# Patient Record
Sex: Female | Born: 1965 | Hispanic: No | Marital: Married | Smoking: Never smoker
Health system: Southern US, Community
[De-identification: ages and names within clinical notes are randomized; demographics above are authoritative.]

## PROBLEM LIST (undated history)

## (undated) DIAGNOSIS — C801 Malignant (primary) neoplasm, unspecified: Secondary | ICD-10-CM

## (undated) HISTORY — DX: Malignant (primary) neoplasm, unspecified: C80.1

---

## 2003-10-08 ENCOUNTER — Emergency Department (HOSPITAL_COMMUNITY): Admission: EM | Admit: 2003-10-08 | Discharge: 2003-10-08 | Payer: Self-pay | Admitting: Emergency Medicine

## 2007-07-17 ENCOUNTER — Ambulatory Visit: Payer: Self-pay | Admitting: Unknown Physician Specialty

## 2008-07-17 ENCOUNTER — Ambulatory Visit: Payer: Self-pay | Admitting: Unknown Physician Specialty

## 2009-07-21 ENCOUNTER — Ambulatory Visit: Payer: Self-pay | Admitting: Unknown Physician Specialty

## 2010-04-21 ENCOUNTER — Ambulatory Visit: Payer: Self-pay | Admitting: Unknown Physician Specialty

## 2010-05-12 ENCOUNTER — Ambulatory Visit: Payer: Self-pay | Admitting: General Surgery

## 2010-05-27 ENCOUNTER — Ambulatory Visit: Payer: Self-pay | Admitting: Anesthesiology

## 2010-05-31 ENCOUNTER — Ambulatory Visit: Payer: Self-pay | Admitting: General Surgery

## 2010-06-01 LAB — PATHOLOGY REPORT

## 2010-06-08 ENCOUNTER — Ambulatory Visit: Payer: Self-pay | Admitting: Internal Medicine

## 2010-06-25 ENCOUNTER — Ambulatory Visit: Payer: Self-pay | Admitting: Unknown Physician Specialty

## 2010-07-09 ENCOUNTER — Ambulatory Visit: Payer: Self-pay | Admitting: Internal Medicine

## 2010-08-08 ENCOUNTER — Ambulatory Visit: Payer: Self-pay | Admitting: Internal Medicine

## 2010-09-08 ENCOUNTER — Ambulatory Visit: Payer: Self-pay | Admitting: Internal Medicine

## 2010-10-22 ENCOUNTER — Ambulatory Visit: Payer: Self-pay | Admitting: Internal Medicine

## 2010-11-08 ENCOUNTER — Ambulatory Visit: Payer: Self-pay | Admitting: Internal Medicine

## 2011-05-12 ENCOUNTER — Ambulatory Visit (INDEPENDENT_AMBULATORY_CARE_PROVIDER_SITE_OTHER): Payer: 59 | Admitting: Obstetrics & Gynecology

## 2011-05-12 ENCOUNTER — Encounter: Payer: Self-pay | Admitting: Obstetrics & Gynecology

## 2011-05-12 VITALS — BP 153/96 | HR 78 | Resp 17 | Ht 63.0 in | Wt 129.0 lb

## 2011-05-12 DIAGNOSIS — Z124 Encounter for screening for malignant neoplasm of cervix: Secondary | ICD-10-CM

## 2011-05-12 DIAGNOSIS — Z0000 Encounter for general adult medical examination without abnormal findings: Secondary | ICD-10-CM

## 2011-05-12 DIAGNOSIS — Z113 Encounter for screening for infections with a predominantly sexual mode of transmission: Secondary | ICD-10-CM

## 2011-05-12 DIAGNOSIS — Z01419 Encounter for gynecological examination (general) (routine) without abnormal findings: Secondary | ICD-10-CM

## 2011-05-12 NOTE — Progress Notes (Signed)
Subjective:    Brittany Underwood is a 46 y.o. female who presents for an annual exam. She says that her husband complains with sex that he can feel her IUD strings. The patient is sexually active. GYN screening history: last pap: was normal. The patient wears seatbelts: yes. The patient participates in regular exercise: not asked. Has the patient ever been transfused or tattooed?: no. The patient reports that there is not domestic violence in her life.   Menstrual History: OB History    Grav Para Term Preterm Abortions TAB SAB Ect Mult Living   4 4 4       4       Menarche age:  No LMP recorded. Patient is not currently having periods (Reason: Other).    The following portions of the patient's history were reviewed and updated as appropriate: allergies, current medications, past family history, past medical history, past social history, past surgical history and problem list.  Review of Systems A comprehensive review of systems was negative. She has not had a period since her chemotherapy June 2012. She also had an IUD placed at about the same time. She will call and schedule her mammogram. She has been married for 8 years to her second husband.   Objective:    BP 153/96  Pulse 78  Resp 17  Ht 5\' 3"  (1.6 m)  Wt 129 lb (58.514 kg)  BMI 22.85 kg/m2  General Appearance:    Alert, cooperative, no distress, appears stated age  Head:    Normocephalic, without obvious abnormality, atraumatic  Eyes:    PERRL, conjunctiva/corneas clear, EOM's intact, fundi    benign, both eyes  Ears:    Normal TM's and external ear canals, both ears  Nose:   Nares normal, septum midline, mucosa normal, no drainage    or sinus tenderness  Throat:   Lips, mucosa, and tongue normal; teeth and gums normal  Neck:   Supple, symmetrical, trachea midline, no adenopathy;    thyroid:  no enlargement/tenderness/nodules; no carotid   bruit or JVD  Back:     Symmetric, no curvature, ROM normal, no CVA tenderness   Lungs:     Clear to auscultation bilaterally, respirations unlabored  Chest Wall:    No tenderness or deformity   Heart:    Regular rate and rhythm, S1 and S2 normal, no murmur, rub   or gallop  Breast Exam:    No tenderness, masses, or nipple abnormality  Abdomen:     Soft, non-tender, bowel sounds active all four quadrants,    no masses, no organomegaly  Genitalia:    Normal female without lesion, discharge or tenderness, NSSA, NT, mobile, no adnexal masses, Her IUD strings (which look more like Copper T strings) were cut flush with the external os     Extremities:   Extremities normal, atraumatic, no cyanosis or edema  Pulses:   2+ and symmetric all extremities  Skin:   Skin color, texture, turgor normal, no rashes or lesions  Lymph nodes:   Cervical, supraclavicular, and axillary nodes normal  Neurologic:   CNII-XII intact, normal strength, sensation and reflexes    throughout  .    Assessment:    Healthy female exam.    Plan:     Mammogram. Pap smear.  FSH

## 2011-05-13 LAB — FOLLICLE STIMULATING HORMONE: FSH: 79.5 m[IU]/mL

## 2011-05-31 HISTORY — PX: MASTECTOMY: SHX3

## 2011-07-20 ENCOUNTER — Encounter: Payer: Self-pay | Admitting: Obstetrics & Gynecology

## 2011-08-25 ENCOUNTER — Encounter: Payer: Self-pay | Admitting: Obstetrics & Gynecology

## 2011-08-25 ENCOUNTER — Ambulatory Visit (INDEPENDENT_AMBULATORY_CARE_PROVIDER_SITE_OTHER): Payer: 59 | Admitting: Obstetrics & Gynecology

## 2011-08-25 VITALS — BP 134/91 | HR 73 | Ht 63.0 in | Wt 130.0 lb

## 2011-08-25 DIAGNOSIS — Z780 Asymptomatic menopausal state: Secondary | ICD-10-CM

## 2011-08-25 NOTE — Progress Notes (Signed)
  Subjective:    Patient ID: Brittany Underwood, female    DOB: 1965/04/05, 46 y.o.   MRN: [id number]  HPI  She is here to discuss her lab results. She is amenorrheic with the Copper T IUD. She became amenorrheic after her chemotherapy for breast cancer. She says that she is rarely sexually active and that the IUD strings which I cut at her request last visit are no longer bothering her. I have explained that she is in the post menopausal state and does not need the IUD any longer, but she refuses to have it removed currently. She also would like a note saying that she can return to work. Review of Systems     Objective:   Physical Exam        Assessment & Plan:  Contraception- no longer needed, but she wants to keep her IUD for the present. I have given her a note to return to work.

## 2011-08-25 NOTE — Progress Notes (Signed)
Patient would like to return to work and wants to be sure with you that this is ok.

## 2012-08-22 ENCOUNTER — Ambulatory Visit (INDEPENDENT_AMBULATORY_CARE_PROVIDER_SITE_OTHER): Payer: 59 | Admitting: Obstetrics & Gynecology

## 2012-08-22 ENCOUNTER — Encounter: Payer: Self-pay | Admitting: Obstetrics & Gynecology

## 2012-08-22 VITALS — BP 127/96 | HR 76 | Ht 63.0 in | Wt 123.0 lb

## 2012-08-22 DIAGNOSIS — Z800 Family history of malignant neoplasm of digestive organs: Secondary | ICD-10-CM

## 2012-08-22 DIAGNOSIS — Z1151 Encounter for screening for human papillomavirus (HPV): Secondary | ICD-10-CM

## 2012-08-22 DIAGNOSIS — Z01419 Encounter for gynecological examination (general) (routine) without abnormal findings: Secondary | ICD-10-CM

## 2012-08-22 DIAGNOSIS — Z124 Encounter for screening for malignant neoplasm of cervix: Secondary | ICD-10-CM

## 2012-08-22 DIAGNOSIS — Z0000 Encounter for general adult medical examination without abnormal findings: Secondary | ICD-10-CM

## 2012-08-22 NOTE — Progress Notes (Signed)
Subjective:    Brittany Underwood is a 47 y.o. female who presents for an annual exam. The patient has no complaints today. The patient is sexually active. GYN screening history: last pap: was normal. The patient wears seatbelts: yes. The patient participates in regular exercise: no. Has the patient ever been transfused or tattooed?: no. The patient reports that there is not domestic violence in her life.   Menstrual History: OB History   Grav Para Term Preterm Abortions TAB SAB Ect Mult Living   4 4 4       4       Menarche age: 38  No LMP recorded. Patient is not currently having periods (Reason: Other).    The following portions of the patient's history were reviewed and updated as appropriate: allergies, current medications, past family history, past medical history, past social history, past surgical history and problem list.  Review of Systems A comprehensive review of systems was negative. She has been married for 9 years. No dyspareunia, rare sex (monthly) She works at [employer], builds Retail buyer. She reports that her mammogram was normal 4/14.   Objective:    BP 127/96  Pulse 76  Ht 5\' 3"  (1.6 m)  Wt 123 lb (55.792 kg)  BMI 21.79 kg/m2  General Appearance:    Alert, cooperative, no distress, appears stated age  Head:    Normocephalic, without obvious abnormality, atraumatic  Eyes:    PERRL, conjunctiva/corneas clear, EOM's intact, fundi    benign, both eyes  Ears:    Normal TM's and external ear canals, both ears  Nose:   Nares normal, septum midline, mucosa normal, no drainage    or sinus tenderness  Throat:   Lips, mucosa, and tongue normal; teeth and gums normal  Neck:   Supple, symmetrical, trachea midline, no adenopathy;    thyroid:  no enlargement/tenderness/nodules; no carotid   bruit or JVD  Back:     Symmetric, no curvature, ROM normal, no CVA tenderness  Lungs:     Clear to auscultation bilaterally, respirations unlabored  Chest Wall:    No tenderness or  deformity   Heart:    Regular rate and rhythm, S1 and S2 normal, no murmur, rub   or gallop  Breast Exam:    No tenderness, masses, or nipple abnormality  Abdomen:     Soft, non-tender, bowel sounds active all four quadrants,    no masses, no organomegaly  Genitalia:    Normal female without lesion, discharge or tenderness, mild atrophy, NSSA, NT, mobile, normal tiny adnexa     Extremities:   Extremities normal, atraumatic, no cyanosis or edema, there is a swelling of 3 cm on the dorsum of her left foot with a scab on top. She says that this is a "birth mark" and that a dermatologist said it was normal.  Pulses:   2+ and symmetric all extremities  Skin:   Skin color, texture, turgor normal, no rashes or lesions  Lymph nodes:   Cervical, supraclavicular, and axillary nodes normal  Neurologic:   CNII-XII intact, normal strength, sensation and reflexes    throughout  .    Assessment:    Healthy female exam.  76 yo brother with colon cancer Menopause   Plan:     Thin prep Pap smear.  GI referral for possible colonoscopy Rec weight bearing exercise She declines to have her Paraguard removed until it is expired. She understands that it is not needed for birth control.

## 2012-11-03 ENCOUNTER — Encounter: Payer: Self-pay | Admitting: *Deleted

## 2013-03-04 IMAGING — PT NM PET TUM IMG RESTAG (PS) SKULL BASE T - THIGH
1 series · 1 of 1 positions shown · non-contrast
Comparison: none

REASON FOR EXAM: stage 2 node positive breast CA
COMMENTS:

[Series 1004: mm oncology reading_mm oncology reading result ima · 0.65mm/px · 1 of 1 slices shown]
[im 1/1]
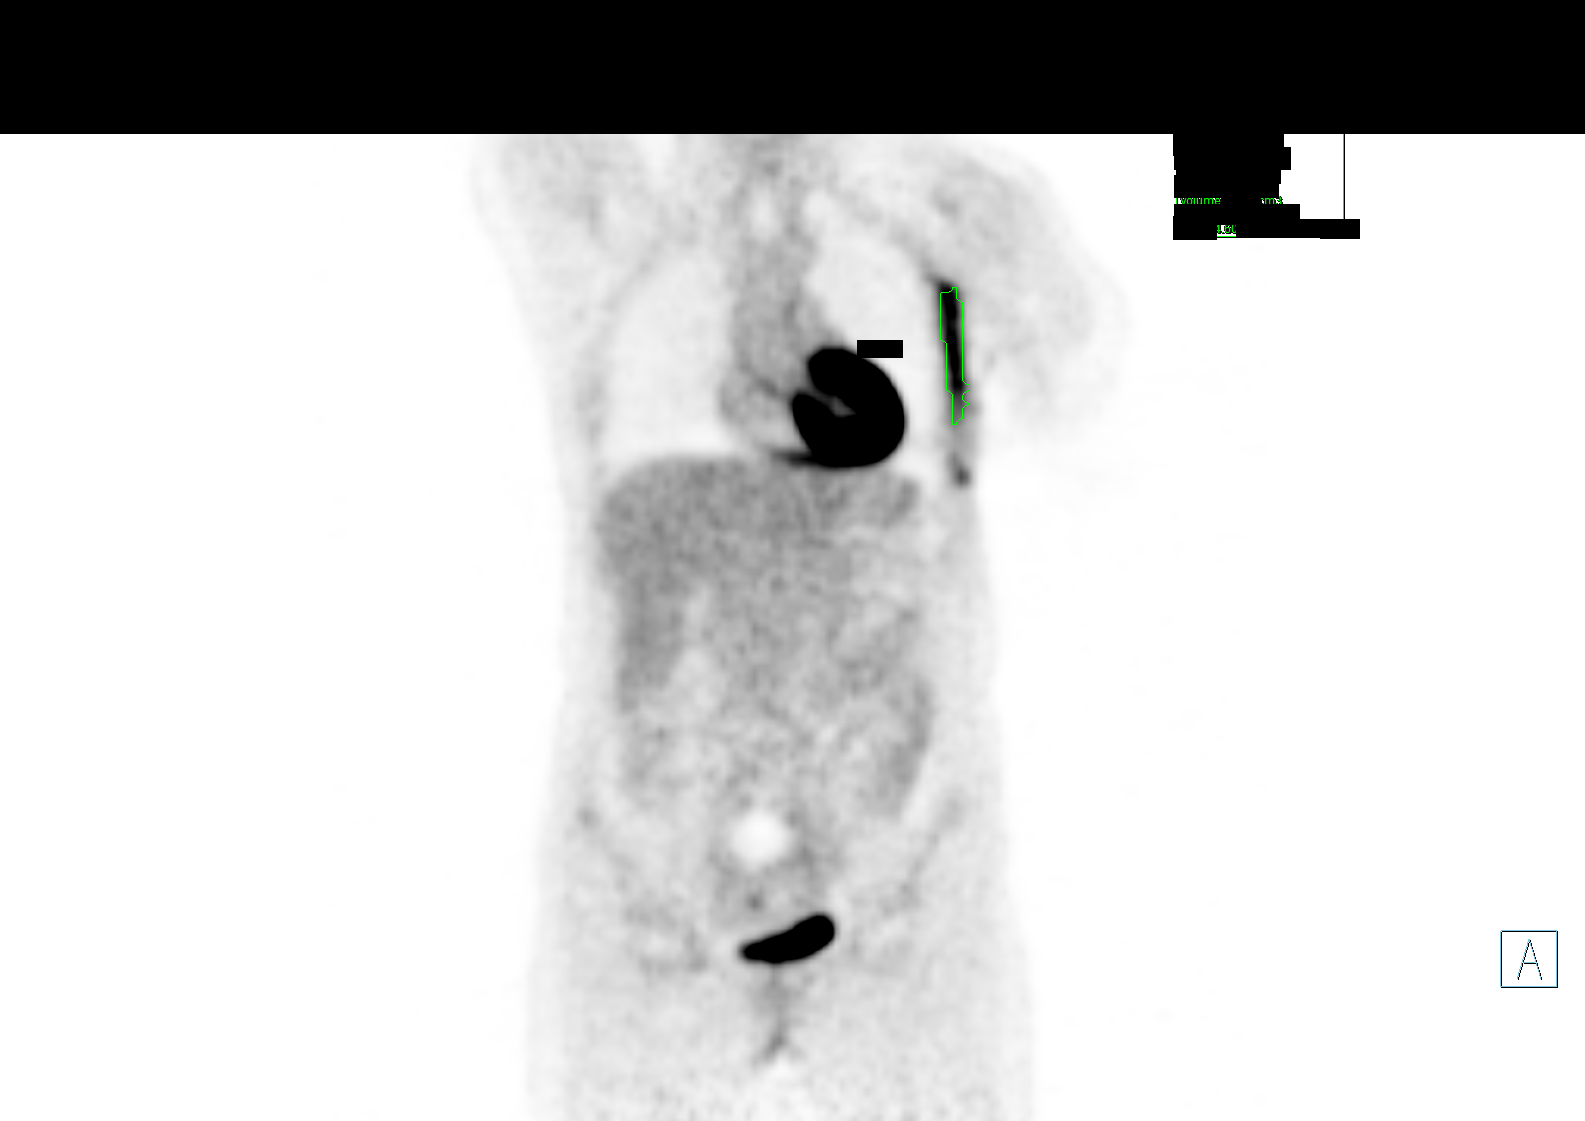

[1 of 1 positions shown; findings below may reference images not displayed]

PROCEDURE:     PET - PET/CT RESTG BREAST CA  - June 25, 2010 [DATE]

RESULT:     The patient has a fasting blood glucose level of 100 mg/dl. The
patient received an injection of 11.68 mCi of fluorine-00 labeled
fluorodeoxyglucose in the right antecubital region at [DATE] a.m. with imaging
performed from the base of the brain into the thighs between the hours of
[DATE] a.m. and [DATE] a.m. Non-contrast CT is performed over the same regions
for the purposes of attenuation, correction and fusion. The noncontrast CT,
attenuation corrected PET images and fused PET/CT data are reconstructed in
the axial, coronal and sagittal planes by the [employer] software for
interpretation. There is no previous exam for comparison.

There is a history of left mastectomy. There appears to be a surgical drain
in the surgical bed extending to the left axilla and exiting anteriorly.
There is increased tracer localization in the region of the surgical bed and
surgical drain with an area of photopenic soft tissue density in the left
breast region which could represent postoperative hematoma or seroma. There
is surrounding increased localization in this region. The activity shows a
maximum localization of 5.7 SUV with a mean of 3.27 SUV in the left chest
wall and surgical bed region. No additional abnormal localization is
evident. There is no evidence of definite abnormal bony localization in the
left chest wall.
IMPRESSION: Uptake in the surgical bed and left chest region in the
chest wall as described. This could be secondary to postoperative changes.
It is difficult to exclude malignancy, but given the presumed recent surgery
with a residual drain in the tissues it is difficult to differentiate
inflammation versus malignant disease. The appearance of the activity is
nonfocal and predominantly at its greatest along the surgical drain. Is
there postoperative infection causing increased localization?

## 2013-05-27 ENCOUNTER — Encounter: Payer: Self-pay | Admitting: *Deleted

## 2013-06-10 ENCOUNTER — Encounter: Payer: Self-pay | Admitting: Obstetrics & Gynecology

## 2013-06-10 ENCOUNTER — Other Ambulatory Visit (HOSPITAL_COMMUNITY): Admission: RE | Admit: 2013-06-10 | Payer: 59 | Source: Ambulatory Visit | Admitting: Obstetrics & Gynecology

## 2013-06-10 ENCOUNTER — Ambulatory Visit (INDEPENDENT_AMBULATORY_CARE_PROVIDER_SITE_OTHER): Payer: 59 | Admitting: Obstetrics & Gynecology

## 2013-06-10 VITALS — BP 108/85 | HR 79 | Ht 63.0 in | Wt 131.0 lb

## 2013-06-10 DIAGNOSIS — Z0000 Encounter for general adult medical examination without abnormal findings: Secondary | ICD-10-CM

## 2013-06-10 DIAGNOSIS — Z1151 Encounter for screening for human papillomavirus (HPV): Secondary | ICD-10-CM

## 2013-06-10 DIAGNOSIS — Z01419 Encounter for gynecological examination (general) (routine) without abnormal findings: Secondary | ICD-10-CM

## 2013-06-10 DIAGNOSIS — Z124 Encounter for screening for malignant neoplasm of cervix: Secondary | ICD-10-CM

## 2013-06-10 NOTE — Progress Notes (Signed)
Subjective:    Brittany Underwood is a 48 y.o. female who presents for an annual exam. The patient has no complaints today. The patient is sexually active. GYN screening history: last pap: was normal. The patient wears seatbelts: yes. The patient participates in regular exercise: yes. Has the patient ever been transfused or tattooed?: no. The patient reports that there is not domestic violence in her life.   Menstrual History: OB History   Grav Para Term Preterm Abortions TAB SAB Ect Mult Living   4 4 4       4       Menarche age: 31  No LMP recorded. Patient is not currently having periods (Reason: IUD).    The following portions of the patient's history were reviewed and updated as appropriate: allergies, current medications, past family history, past medical history, past social history, past surgical history and problem list.  Review of Systems A comprehensive review of systems was negative. Married for 10 years, no dyspareunia. Had normal colonoscopy and good for 5 years. Her mammogram was normal last month.   Objective:    BP 108/85  Pulse 79  Ht 5\' 3"  (1.6 m)  Wt 131 lb (59.421 kg)  BMI 23.21 kg/m2  General Appearance:    Alert, cooperative, no distress, appears stated age  Head:    Normocephalic, without obvious abnormality, atraumatic  Eyes:    PERRL, conjunctiva/corneas clear, EOM's intact, fundi    benign, both eyes  Ears:    Normal TM's and external ear canals, both ears  Nose:   Nares normal, septum midline, mucosa normal, no drainage    or sinus tenderness  Throat:   Lips, mucosa, and tongue normal; teeth and gums normal  Neck:   Supple, symmetrical, trachea midline, no adenopathy;    thyroid:  no enlargement/tenderness/nodules; no carotid   bruit or JVD  Back:     Symmetric, no curvature, ROM normal, no CVA tenderness  Lungs:     Clear to auscultation bilaterally, respirations unlabored  Chest Wall:    No tenderness or deformity   Heart:    Regular rate and rhythm,  S1 and S2 normal, no murmur, rub   or gallop  Breast Exam:    No tenderness, masses, or nipple abnormality on right, left surgically absent, no adenopathy bilaterally  Abdomen:     Soft, non-tender, bowel sounds active all four quadrants,    no masses, no organomegaly  Genitalia:    Normal female without lesion, discharge or tenderness, NSSA, NT, mobile, normal adnexal exam     Extremities:   Extremities normal, atraumatic, no cyanosis or edema  Pulses:   2+ and symmetric all extremities  Skin:   Skin color, texture, turgor normal, no rashes or lesions  Lymph nodes:   Cervical, supraclavicular, and axillary nodes normal  Neurologic:   CNII-XII intact, normal strength, sensation and reflexes    throughout  .    Assessment:    Healthy female exam.    Plan:     Breast self exam technique reviewed and patient encouraged to perform self-exam monthly. Thin prep Pap smear.  Schedule fasting labs

## 2013-06-17 ENCOUNTER — Other Ambulatory Visit (INDEPENDENT_AMBULATORY_CARE_PROVIDER_SITE_OTHER): Payer: 59 | Admitting: *Deleted

## 2013-06-17 DIAGNOSIS — Z01419 Encounter for gynecological examination (general) (routine) without abnormal findings: Secondary | ICD-10-CM

## 2013-06-17 LAB — COMPREHENSIVE METABOLIC PANEL
ALT: 11 U/L (ref 0–35)
AST: 17 U/L (ref 0–37)
Albumin: 4.1 g/dL (ref 3.5–5.2)
Alkaline Phosphatase: 60 U/L (ref 39–117)
BILIRUBIN TOTAL: 1.1 mg/dL (ref 0.2–1.2)
BUN: 19 mg/dL (ref 6–23)
CALCIUM: 9.5 mg/dL (ref 8.4–10.5)
CHLORIDE: 102 meq/L (ref 96–112)
CO2: 24 mEq/L (ref 19–32)
Creat: 0.95 mg/dL (ref 0.50–1.10)
Glucose, Bld: 83 mg/dL (ref 70–99)
POTASSIUM: 4 meq/L (ref 3.5–5.3)
Sodium: 137 mEq/L (ref 135–145)
Total Protein: 7.7 g/dL (ref 6.0–8.3)

## 2013-06-17 LAB — LIPID PANEL
Cholesterol: 239 mg/dL — ABNORMAL HIGH (ref 0–200)
HDL: 73 mg/dL (ref >39)
LDL Cholesterol: 153 mg/dL — ABNORMAL HIGH (ref 0–99)
Total CHOL/HDL Ratio: 3.3 Ratio
Triglycerides: 64 mg/dL (ref <150)
VLDL: 13 mg/dL (ref 0–40)

## 2013-06-17 NOTE — Progress Notes (Signed)
Fasting labs drawn.

## 2013-06-18 LAB — CBC
HCT: 40 % (ref 36.0–46.0)
HEMOGLOBIN: 13 g/dL (ref 12.0–15.0)
MCH: 26.2 pg (ref 26.0–34.0)
MCHC: 32.5 g/dL (ref 30.0–36.0)
MCV: 80.6 fL (ref 78.0–100.0)
PLATELETS: 290 10*3/uL (ref 150–400)
RBC: 4.96 MIL/uL (ref 3.87–5.11)
RDW: 16.3 % — ABNORMAL HIGH (ref 11.5–15.5)
WBC: 6.3 10*3/uL (ref 4.0–10.5)

## 2013-06-18 LAB — TSH: TSH: 3.453 u[IU]/mL (ref 0.350–4.500)

## 2013-09-03 ENCOUNTER — Telehealth: Payer: Self-pay | Admitting: *Deleted

## 2013-09-03 NOTE — Telephone Encounter (Signed)
Spoke to patient briefly.  She said she would have to call me back.

## 2013-09-03 NOTE — Telephone Encounter (Signed)
Message copied by Erik Obey on Tue Sep 03, 2013 10:06 AM ------      Message from: Clovia Cuff C      Created: Thu Jun 27, 2013  9:01 AM       She should go see her FP about her elevated lipids.      Thanks ------

## 2013-12-09 ENCOUNTER — Encounter: Payer: Self-pay | Admitting: Obstetrics & Gynecology

## 2015-02-26 ENCOUNTER — Telehealth: Payer: Self-pay | Admitting: *Deleted

## 2015-02-26 DIAGNOSIS — Z853 Personal history of malignant neoplasm of breast: Secondary | ICD-10-CM

## 2015-02-26 NOTE — Telephone Encounter (Signed)
-----   Message from Francia Greaves sent at 02/24/2015  2:02 PM EST ----- Regarding: Order Contact: [phone] Needs diagnostic order for mammogram, so she can call back and schedule the appt herself, dx w/ breast cancer last year, please call patient once this is done

## 2015-02-26 NOTE — Telephone Encounter (Signed)
Diagnostic mammogram ordered, called pt to inform her that order has been placed.

## 2016-08-23 ENCOUNTER — Other Ambulatory Visit: Payer: Self-pay | Admitting: *Deleted

## 2016-08-23 ENCOUNTER — Inpatient Hospital Stay
Admission: RE | Admit: 2016-08-23 | Discharge: 2016-08-23 | Disposition: A | Discharge status: Home / Self Care | Payer: Self-pay | Source: Ambulatory Visit | Attending: *Deleted | Admitting: *Deleted

## 2016-08-23 DIAGNOSIS — Z9289 Personal history of other medical treatment: Secondary | ICD-10-CM

## 2022-01-27 DIAGNOSIS — M654 Radial styloid tenosynovitis [de Quervain]: Secondary | ICD-10-CM | POA: Diagnosis not present

## 2022-02-10 DIAGNOSIS — M654 Radial styloid tenosynovitis [de Quervain]: Secondary | ICD-10-CM | POA: Diagnosis not present

## 2022-07-15 NOTE — Progress Notes (Unsigned)
New patient visit   Patient: Brittany Underwood   DOB: 08-30-1965   57 y.o. Female  MRN: [id number] Visit Date: 07/18/2022  Today's healthcare provider: Ronnald Ramp, MD   No chief complaint on file.  Subjective    Brittany Underwood is a 57 y.o. female who presents today as a new patient to establish care.  HPI   Encounter to Establish Care Patient presents to establish care  Introduced myself and my role as primary care physician  We reviewed patient's medical, surgical, and social history and medications as listed below    PMHX   Last annual physical: ***   *** Medications: ***   ***  Medications: ***    Social Hx  Tobacco use: *** Alcohol Use : *** Illicit drug use: ***  ***    Concerns for Today:   ***:   Past Medical History:  Diagnosis Date   Cancer (HCC)    Breast  LT   Past Surgical History:  Procedure Laterality Date   MASTECTOMY  05/31/11   Left   Family Status  Relation Name Status   Father  (Not Specified)   Brother  (Not Specified)   Family History  Problem Relation Age of Onset   Hypertension Father    Cancer Brother        colon   Social History   Socioeconomic History   Marital status: Married    Spouse name: Not on file   Number of children: Not on file   Years of education: Not on file   Highest education level: Not on file  Occupational History   Occupation: unemployed  Tobacco Use   Smoking status: Never   Smokeless tobacco: Never  Substance and Sexual Activity   Alcohol use: No   Drug use: No   Sexual activity: Yes    Partners: Male  Other Topics Concern   Not on file  Social History Narrative   Not on file   Social Determinants of Health   Financial Resource Strain: Not on file  Food Insecurity: Not on file  Transportation Needs: Not on file  Physical Activity: Not on file  Stress: Not on file  Social Connections: Not on file   Outpatient Medications Prior to Visit  Medication Sig    IUD's (PARAGARD INTRAUTERINE COPPER IU) by Intrauterine route.   No facility-administered medications prior to visit.   No Known Allergies   There is no immunization history on file for this patient.  Health Maintenance  Topic Date Due   COVID-19 Vaccine (1) Never done   HIV Screening  Never done   Hepatitis C Screening  Never done   DTaP/Tdap/Td (1 - Tdap) Never done   Colonoscopy  Never done   PAP SMEAR-Modifier  06/10/2016   MAMMOGRAM  08/06/2016   Zoster Vaccines- Shingrix (1 of 2) Never done   INFLUENZA VACCINE  09/08/2022   HPV VACCINES  Aged Out    No care team member to display  Review of Systems  {Labs  Heme  Chem  Endocrine  Serology  Results Review (optional):23779}   Objective    There were no vitals taken for this visit. {Show previous vital signs (optional):23777}  Physical Exam ***  Depression Screen     No data to display         No results found for any visits on 07/18/22.  Assessment & Plan     ***  No follow-ups on file.     {  provider attestation***:1}   Ronnald Ramp, MD  [facility] [phone] (phone) [phone] (fax)  [facility]

## 2022-07-18 ENCOUNTER — Ambulatory Visit (INDEPENDENT_AMBULATORY_CARE_PROVIDER_SITE_OTHER): Payer: BC Managed Care – PPO | Admitting: Family Medicine

## 2022-07-18 ENCOUNTER — Encounter: Payer: Self-pay | Admitting: Family Medicine

## 2022-07-18 ENCOUNTER — Other Ambulatory Visit (HOSPITAL_COMMUNITY)
Admission: RE | Admit: 2022-07-18 | Discharge: 2022-07-18 | Disposition: A | Discharge status: Home / Self Care | Payer: BC Managed Care – PPO | Source: Ambulatory Visit | Attending: Family Medicine | Admitting: Family Medicine

## 2022-07-18 VITALS — BP 147/92 | HR 80 | Ht 62.0 in | Wt 142.6 lb

## 2022-07-18 DIAGNOSIS — E663 Overweight: Secondary | ICD-10-CM | POA: Diagnosis not present

## 2022-07-18 DIAGNOSIS — Z1159 Encounter for screening for other viral diseases: Secondary | ICD-10-CM | POA: Diagnosis not present

## 2022-07-18 DIAGNOSIS — Z114 Encounter for screening for human immunodeficiency virus [HIV]: Secondary | ICD-10-CM | POA: Diagnosis not present

## 2022-07-18 DIAGNOSIS — Z202 Contact with and (suspected) exposure to infections with a predominantly sexual mode of transmission: Secondary | ICD-10-CM | POA: Diagnosis not present

## 2022-07-18 DIAGNOSIS — Z7689 Persons encountering health services in other specified circumstances: Secondary | ICD-10-CM

## 2022-07-18 DIAGNOSIS — R030 Elevated blood-pressure reading, without diagnosis of hypertension: Secondary | ICD-10-CM

## 2022-07-18 DIAGNOSIS — Z0000 Encounter for general adult medical examination without abnormal findings: Secondary | ICD-10-CM | POA: Diagnosis not present

## 2022-07-18 DIAGNOSIS — R051 Acute cough: Secondary | ICD-10-CM

## 2022-07-18 DIAGNOSIS — Z124 Encounter for screening for malignant neoplasm of cervix: Secondary | ICD-10-CM

## 2022-07-18 DIAGNOSIS — R2242 Localized swelling, mass and lump, left lower limb: Secondary | ICD-10-CM

## 2022-07-18 DIAGNOSIS — Z1231 Encounter for screening mammogram for malignant neoplasm of breast: Secondary | ICD-10-CM

## 2022-07-18 DIAGNOSIS — Z23 Encounter for immunization: Secondary | ICD-10-CM

## 2022-07-18 MED ORDER — AMLODIPINE BESYLATE 5 MG PO TABS: 5.0000 mg | ORAL_TABLET | Freq: Every day | ORAL | 1 refills | Status: DC

## 2022-07-18 MED ORDER — CETIRIZINE HCL 10 MG PO TABS
10.0000 mg | ORAL_TABLET | Freq: Every day | ORAL | 11 refills | Status: AC
Start: 2022-07-18 — End: ?

## 2022-07-18 MED ORDER — BENZONATATE 200 MG PO CAPS
200.0000 mg | ORAL_CAPSULE | Freq: Two times a day (BID) | ORAL | 0 refills | Status: DC | PRN
Start: 2022-07-18 — End: 2022-08-02

## 2022-07-18 NOTE — Assessment & Plan Note (Signed)
Acute  Present for 3 weeks  Recommended treating symptoms that likely are related to seasonal allergies with cetirizine 10mg  daily  Recommended flonase however patient does not want to use nasal spray  Will also treat cough with tessalon perles 200mg  twice daily PRN  F/u PRN if no improvement

## 2022-07-18 NOTE — Assessment & Plan Note (Signed)
Welcomed patient to [facility]  Reviewed patient's medical history, medications, surgical and social history Discussed roles and expectations for primary care physician-patient relationship Recommended patient schedule annual preventative examinations   

## 2022-07-18 NOTE — Assessment & Plan Note (Signed)
Chronic conditions are stable  Patient was counseled on benefits of regular physical activity with goal of 150 minutes of moderate to vigurous intensity 4 days per week  Patient was counseled to consume well balanced diet of fruits, vegetables, limited saturated fats and limited sugary foods and beverages with emphasis on consuming 6-8 glasses of water daily  Screening recommended today: HIV, Hep C, A1c, lipid panel, STI screenings including RPR, gonorrhea, chlamydia and herpes testing, cervical CA screening was collected today and mammogram ordered  Vaccines recommended today: Shingrix and Tetanus vaccine, patient was agreeable to both vaccines today

## 2022-07-18 NOTE — Assessment & Plan Note (Signed)
Chronic  Reports no prior medications  Will start amlodipine 5mg  daily  CMP collected  F/u in 3 weeks for BP

## 2022-07-18 NOTE — Patient Instructions (Signed)
We will follow up with results of labs once they are available.   I have prescribed cetirizine 10mg  daily to help with your allergy symptoms and tessalon perles to help with your cough    Your blood pressure was elevated today so I recommend starting amlodipine 5mg  daily for blood pressure.   Please see me in 3 weeks for blood pressure follow up    Health Maintenance, Female Adopting a healthy lifestyle and getting preventive care are important in promoting health and wellness. Ask your health care provider about: The right schedule for you to have regular tests and exams. Things you can do on your own to prevent diseases and keep yourself healthy. What should I know about diet, weight, and exercise? Eat a healthy diet  Eat a diet that includes plenty of vegetables, fruits, low-fat dairy products, and lean protein. Do not eat a lot of foods that are high in solid fats, added sugars, or sodium. Maintain a healthy weight Body mass index (BMI) is used to identify weight problems. It estimates body fat based on height and weight. Your health care provider can help determine your BMI and help you achieve or maintain a healthy weight. Get regular exercise Get regular exercise. This is one of the most important things you can do for your health. Most adults should: Exercise for at least 150 minutes each week. The exercise should increase your heart rate and make you sweat (moderate-intensity exercise). Do strengthening exercises at least twice a week. This is in addition to the moderate-intensity exercise. Spend less time sitting. Even light physical activity can be beneficial. Watch cholesterol and blood lipids Have your blood tested for lipids and cholesterol at 56 years of age, then have this test every 5 years. Have your cholesterol levels checked more often if: Your lipid or cholesterol levels are high. You are older than 56 years of age. You are at high risk for heart disease. What  should I know about cancer screening? Depending on your health history and family history, you may need to have cancer screening at various ages. This may include screening for: Breast cancer. Cervical cancer. Colorectal cancer. Skin cancer. Lung cancer. What should I know about heart disease, diabetes, and high blood pressure? Blood pressure and heart disease High blood pressure causes heart disease and increases the risk of stroke. This is more likely to develop in people who have high blood pressure readings or are overweight. Have your blood pressure checked: Every 3-5 years if you are 62-51 years of age. Every year if you are 57 years old or older. Diabetes Have regular diabetes screenings. This checks your fasting blood sugar level. Have the screening done: Once every three years after age 39 if you are at a normal weight and have a low risk for diabetes. More often and at a younger age if you are overweight or have a high risk for diabetes. What should I know about preventing infection? Hepatitis B If you have a higher risk for hepatitis B, you should be screened for this virus. Talk with your health care provider to find out if you are at risk for hepatitis B infection. Hepatitis C Testing is recommended for: Everyone born from [birth year] through 1964. Anyone with known risk factors for hepatitis C. Sexually transmitted infections (STIs) Get screened for STIs, including gonorrhea and chlamydia, if: You are sexually active and are younger than 56 years of age. You are older than 56 years of age and your health care provider  tells you that you are at risk for this type of infection. Your sexual activity has changed since you were last screened, and you are at increased risk for chlamydia or gonorrhea. Ask your health care provider if you are at risk. Ask your health care provider about whether you are at high risk for HIV. Your health care provider may recommend a prescription medicine  to help prevent HIV infection. If you choose to take medicine to prevent HIV, you should first get tested for HIV. You should then be tested every 3 months for as long as you are taking the medicine. Pregnancy If you are about to stop having your period (premenopausal) and you may become pregnant, seek counseling before you get pregnant. Take 400 to 800 micrograms (mcg) of folic acid every day if you become pregnant. Ask for birth control (contraception) if you want to prevent pregnancy. Osteoporosis and menopause Osteoporosis is a disease in which the bones lose minerals and strength with aging. This can result in bone fractures. If you are 63 years old or older, or if you are at risk for osteoporosis and fractures, ask your health care provider if you should: Be screened for bone loss. Take a calcium or vitamin D supplement to lower your risk of fractures. Be given hormone replacement therapy (HRT) to treat symptoms of menopause. Follow these instructions at home: Alcohol use Do not drink alcohol if: Your health care provider tells you not to drink. You are pregnant, may be pregnant, or are planning to become pregnant. If you drink alcohol: Limit how much you have to: 0-1 drink a day. Know how much alcohol is in your drink. In the [place] one drink equals one 12 oz bottle of beer (355 mL), one 5 oz glass of wine (148 mL), or one 1 oz glass of hard liquor (44 mL). Lifestyle Do not use any products that contain nicotine or tobacco. These products include cigarettes, chewing tobacco, and vaping devices, such as e-cigarettes. If you need help quitting, ask your health care provider. Do not use street drugs. Do not share needles. Ask your health care provider for help if you need support or information about quitting drugs. General instructions Schedule regular health, dental, and eye exams. Stay current with your vaccines. Tell your health care provider if: You often feel depressed. You  have ever been abused or do not feel safe at home. Summary Adopting a healthy lifestyle and getting preventive care are important in promoting health and wellness. Follow your health care provider's instructions about healthy diet, exercising, and getting tested or screened for diseases. Follow your health care provider's instructions on monitoring your cholesterol and blood pressure. This information is not intended to replace advice given to you by your health care provider. Make sure you discuss any questions you have with your health care provider. Document Revised: 06/15/2020 Document Reviewed: 06/15/2020 Elsevier Patient Education  2024 [employer].

## 2022-07-18 NOTE — Assessment & Plan Note (Addendum)
CMP, lipid panel, A1c & TSH and free T4 collected today  Advised regular physical activity to help with healthy weight management and including well balanced meals and drinking water 6-8 8-oz cups per day

## 2022-07-18 NOTE — Addendum Note (Signed)
Addended by: Bing Neighbors on: 07/18/2022 04:42 PM   Modules accepted: Orders

## 2022-07-19 LAB — CBC
Hematocrit: 40.2 % (ref 34.0–46.6)
Hemoglobin: 13.1 g/dL (ref 11.1–15.9)
MCH: 25.7 pg — ABNORMAL LOW (ref 26.6–33.0)
MCHC: 32.6 g/dL (ref 31.5–35.7)
MCV: 79 fL (ref 79–97)
Platelets: 370 10*3/uL (ref 150–450)
RBC: 5.09 x10E6/uL (ref 3.77–5.28)
RDW: 15.5 % — ABNORMAL HIGH (ref 11.7–15.4)
WBC: 8.6 10*3/uL (ref 3.4–10.8)

## 2022-07-19 LAB — HEMOGLOBIN A1C
Est. average glucose Bld gHb Est-mCnc: 146 mg/dL
Hgb A1c MFr Bld: 6.7 % — ABNORMAL HIGH (ref 4.8–5.6)

## 2022-07-19 LAB — COMPREHENSIVE METABOLIC PANEL
ALT: 15 IU/L (ref 0–32)
AST: 27 IU/L (ref 0–40)
Albumin/Globulin Ratio: 1.6
Albumin: 4.3 g/dL (ref 3.8–4.9)
Alkaline Phosphatase: 80 IU/L (ref 44–121)
BUN/Creatinine Ratio: 14 (ref 9–23)
BUN: 16 mg/dL (ref 6–24)
Bilirubin Total: 0.3 mg/dL (ref 0.0–1.2)
CO2: 22 mmol/L (ref 20–29)
Calcium: 10.1 mg/dL (ref 8.7–10.2)
Chloride: 105 mmol/L (ref 96–106)
Creatinine, Ser: 1.18 mg/dL — ABNORMAL HIGH (ref 0.57–1.00)
Globulin, Total: 2.7 g/dL (ref 1.5–4.5)
Glucose: 91 mg/dL (ref 70–99)
Potassium: 4.2 mmol/L (ref 3.5–5.2)
Sodium: 144 mmol/L (ref 134–144)
Total Protein: 7 g/dL (ref 6.0–8.5)
eGFR: 55 mL/min/{1.73_m2} — ABNORMAL LOW (ref >59)

## 2022-07-19 LAB — HEPATITIS C ANTIBODY: Hep C Virus Ab: NONREACTIVE

## 2022-07-19 LAB — LIPID PANEL
Chol/HDL Ratio: 4.4 ratio (ref 0.0–4.4)
Cholesterol, Total: 253 mg/dL — ABNORMAL HIGH (ref 100–199)
HDL: 58 mg/dL (ref >39)
LDL Chol Calc (NIH): 151 mg/dL — ABNORMAL HIGH (ref 0–99)
Triglycerides: 244 mg/dL — ABNORMAL HIGH (ref 0–149)
VLDL Cholesterol Cal: 44 mg/dL — ABNORMAL HIGH (ref 5–40)

## 2022-07-19 LAB — RPR: RPR Ser Ql: NONREACTIVE

## 2022-07-19 LAB — HIV ANTIBODY (ROUTINE TESTING W REFLEX): HIV Screen 4th Generation wRfx: NONREACTIVE

## 2022-07-19 LAB — TSH+FREE T4
Free T4: 1.29 ng/dL (ref 0.82–1.77)
TSH: 3.03 u[IU]/mL (ref 0.450–4.500)

## 2022-07-20 ENCOUNTER — Other Ambulatory Visit: Payer: Self-pay

## 2022-07-20 DIAGNOSIS — E119 Type 2 diabetes mellitus without complications: Secondary | ICD-10-CM

## 2022-07-20 DIAGNOSIS — E7800 Pure hypercholesterolemia, unspecified: Secondary | ICD-10-CM

## 2022-07-20 DIAGNOSIS — E782 Mixed hyperlipidemia: Secondary | ICD-10-CM

## 2022-07-20 MED ORDER — METFORMIN HCL 500 MG PO TABS: 500.0000 mg | ORAL_TABLET | Freq: Every day | ORAL | 0 refills | Status: DC

## 2022-07-20 MED ORDER — ROSUVASTATIN CALCIUM 20 MG PO TABS
20.0000 mg | ORAL_TABLET | Freq: Every day | ORAL | 3 refills | Status: AC
Start: 2022-07-20 — End: ?

## 2022-07-25 LAB — CYTOLOGY - PAP
Chlamydia: NEGATIVE
Comment: NEGATIVE
Comment: NEGATIVE
Comment: NEGATIVE
Comment: NEGATIVE
Comment: NORMAL
Diagnosis: NEGATIVE
HSV1: NEGATIVE
HSV2: NEGATIVE
High risk HPV: NEGATIVE
Neisseria Gonorrhea: NEGATIVE
Trichomonas: NEGATIVE

## 2022-08-01 ENCOUNTER — Ambulatory Visit: Payer: Self-pay | Admitting: *Deleted

## 2022-08-01 DIAGNOSIS — R051 Acute cough: Secondary | ICD-10-CM

## 2022-08-01 NOTE — Telephone Encounter (Signed)
Cough Advice   Pt is calling to report ov 07/17/21  Acute cough Acute  Present for 3 weeks reports still coughing requesting refill for benzonatate (TESSALON) 200 MG capsule [[id number]] Please advise

## 2022-08-01 NOTE — Telephone Encounter (Signed)
  Chief Complaint: Lingering cough Symptoms: Cough with clear mucus Frequency: 3 weeks Pertinent Negatives: Patient denies Fever, sob Disposition: [] ED /[] Urgent Care (no appt availability in office) / [] Appointment(In office/virtual)/ []  [place] [facility]/ [] Home Care/ [] Refused Recommended Disposition /[] [place] [facility]/ [x]  Follow-up with PCP Additional Notes: Pt states that she is nearly out of Tessalon, and would like more for lingering cough. Pt states that all other s/s have resolved.  Pt states that on the directions for her new medications, one states that she should take this medication with breakfast. Pt states she does not eat breakfast. Is it ok to take without food.    Summary: Cough Advice   Pt is calling to report ov 07/17/21  Acute cough Acute  Present for 3 weeks reports still coughing requesting refill for benzonatate (TESSALON) 200 MG capsule [[id number]] Please advise       Reason for Disposition  Cough lasts > 3 weeks  Answer Assessment - Initial Assessment Questions 1. ONSET: "When did the cough begin?"      3 weeks 2. SEVERITY: "How bad is the cough today?"      moderate 3. SPUTUM: "Describe the color of your sputum" (none, dry cough; clear, white, yellow, green)     Clear 4. HEMOPTYSIS: "Are you coughing up any blood?" If so ask: "How much?" (flecks, streaks, tablespoons, etc.)     no 5. DIFFICULTY BREATHING: "Are you having difficulty breathing?" If Yes, ask: "How bad is it?" (e.g., mild, moderate, severe)    - MILD: No SOB at rest, mild SOB with walking, speaks normally in sentences, can lie down, no retractions, pulse < 100.    - MODERATE: SOB at rest, SOB with minimal exertion and prefers to sit, cannot lie down flat, speaks in phrases, mild retractions, audible wheezing, pulse 100-120.    - SEVERE: Very SOB at rest, speaks in single words, struggling to breathe, sitting hunched forward, retractions, pulse > 120      no 6. FEVER: "Do you have a  fever?" If Yes, ask: "What is your temperature, how was it measured, and when did it start?"     no   10. OTHER SYMPTOMS: "Do you have any other symptoms?" (e.g., runny nose, wheezing, chest pain)       Just cough  Protocols used: Cough - Chronic-A-AH

## 2022-08-02 MED ORDER — BENZONATATE 200 MG PO CAPS
200.0000 mg | ORAL_CAPSULE | Freq: Two times a day (BID) | ORAL | 0 refills | Status: AC | PRN
Start: 2022-08-02 — End: ?

## 2022-08-02 NOTE — Addendum Note (Signed)
Addended by: Bing Neighbors on: 08/02/2022 11:01 AM   Modules accepted: Orders

## 2022-08-02 NOTE — Telephone Encounter (Signed)
Refill for benzonatate 200mg  twice daily PRN for cough sent to pharmacy   [facility] to take medication any time of day that is convenient for patient.   Dr. Roxan Hockey

## 2022-08-02 NOTE — Telephone Encounter (Signed)
Patient advised. Verbalized understanding

## 2022-08-22 ENCOUNTER — Other Ambulatory Visit: Payer: Self-pay | Admitting: Family Medicine

## 2022-08-22 DIAGNOSIS — E119 Type 2 diabetes mellitus without complications: Secondary | ICD-10-CM

## 2022-08-22 NOTE — Telephone Encounter (Signed)
Requested medications are due for refill today.  yes  Requested medications are on the active medications list.  yes  Last refill. 07/20/2022 #30 0 rf  Future visit scheduled.   yes  Notes to clinic.  Missing labs - platelets.    Requested Prescriptions  Pending Prescriptions Disp Refills   metFORMIN (GLUCOPHAGE) 500 MG tablet [Pharmacy Med Name: METFORMIN 500MG  TABLETS] 30 tablet 0    Sig: TAKE 1 TABLET(500 MG) BY MOUTH DAILY WITH BREAKFAST     Endocrinology:  Diabetes - Biguanides Failed - 08/22/2022  3:23 AM      Failed - Cr in normal range and within 360 days    Creat  Date Value Ref Range Status  06/17/2013 0.95 0.50 - 1.10 mg/dL Final   Creatinine, Ser  Date Value Ref Range Status  07/18/2022 1.18 (H) 0.57 - 1.00 mg/dL Final         Failed - eGFR in normal range and within 360 days    eGFR  Date Value Ref Range Status  07/18/2022 55 (L) >59 mL/min/1.73 Final         Failed - B12 Level in normal range and within 720 days    No results found for: "VITAMINB12"       Failed - CBC within normal limits and completed in the last 12 months    WBC  Date Value Ref Range Status  07/18/2022 8.6 3.4 - 10.8 x10E3/uL Final  06/17/2013 6.3 4.0 - 10.5 K/uL Final   RBC  Date Value Ref Range Status  07/18/2022 5.09 3.77 - 5.28 x10E6/uL Final  06/17/2013 4.96 3.87 - 5.11 MIL/uL Final   Hemoglobin  Date Value Ref Range Status  07/18/2022 13.1 11.1 - 15.9 g/dL Final   Hematocrit  Date Value Ref Range Status  07/18/2022 40.2 34.0 - 46.6 % Final   MCHC  Date Value Ref Range Status  07/18/2022 32.6 31.5 - 35.7 g/dL Final  78/29/5621 30.8 30.0 - 36.0 g/dL Final   [facility]  Date Value Ref Range Status  07/18/2022 25.7 (L) 26.6 - 33.0 pg Final  06/17/2013 26.2 26.0 - 34.0 pg Final   MCV  Date Value Ref Range Status  07/18/2022 79 79 - 97 fL Final   No results found for: "PLTCOUNTKUC", "LABPLAT", "POCPLA" RDW  Date Value Ref Range Status  07/18/2022 15.5 (H) 11.7 - 15.4 %  Final         Passed - HBA1C is between 0 and 7.9 and within 180 days    Hgb A1c MFr Bld  Date Value Ref Range Status  07/18/2022 6.7 (H) 4.8 - 5.6 % Final    Comment:             Prediabetes: 5.7 - 6.4          Diabetes: >6.4          Glycemic control for adults with diabetes: <7.0          Passed - Valid encounter within last 6 months    Recent Outpatient Visits           1 month ago Annual physical exam   [facility] [facility] Simmons-Robinson, Tawanna Cooler, MD       Future Appointments             In 2 weeks Simmons-Robinson, Tawanna Cooler, MD [facility]   In 1 month Simmons-Robinson, [place], MD [facility], [facility]

## 2022-08-24 ENCOUNTER — Ambulatory Visit: Payer: BC Managed Care – PPO | Admitting: Family Medicine

## 2022-08-30 ENCOUNTER — Other Ambulatory Visit: Payer: Self-pay | Admitting: Family Medicine

## 2022-08-30 DIAGNOSIS — E119 Type 2 diabetes mellitus without complications: Secondary | ICD-10-CM

## 2022-08-31 NOTE — Telephone Encounter (Signed)
Requested medication (s) are due for refill today: no  Requested medication (s) are on the active medication list: yes   Last refill:  08/23/22 #30 0 refills   Future visit scheduled: yes in 1 week   Notes to clinic:  To pharmacy: ZERO refills remain on this prescription. Your patient is requesting advance approval of refills for this medication to PREVENT ANY MISSED DOSES   do you want to refill again?     Requested Prescriptions  Pending Prescriptions Disp Refills   metFORMIN (GLUCOPHAGE) 500 MG tablet [Pharmacy Med Name: METFORMIN 500MG  TABLETS] 30 tablet 0    Sig: TAKE 1 TABLET(500 MG) BY MOUTH DAILY WITH BREAKFAST     Endocrinology:  Diabetes - Biguanides Failed - 08/30/2022  8:09 AM      Failed - Cr in normal range and within 360 days    Creat  Date Value Ref Range Status  06/17/2013 0.95 0.50 - 1.10 mg/dL Final   Creatinine, Ser  Date Value Ref Range Status  07/18/2022 1.18 (H) 0.57 - 1.00 mg/dL Final         Failed - eGFR in normal range and within 360 days    eGFR  Date Value Ref Range Status  07/18/2022 55 (L) >59 mL/min/1.73 Final         Failed - B12 Level in normal range and within 720 days    No results found for: "VITAMINB12"       Failed - CBC within normal limits and completed in the last 12 months    WBC  Date Value Ref Range Status  07/18/2022 8.6 3.4 - 10.8 x10E3/uL Final  06/17/2013 6.3 4.0 - 10.5 K/uL Final   RBC  Date Value Ref Range Status  07/18/2022 5.09 3.77 - 5.28 x10E6/uL Final  06/17/2013 4.96 3.87 - 5.11 MIL/uL Final   Hemoglobin  Date Value Ref Range Status  07/18/2022 13.1 11.1 - 15.9 g/dL Final   Hematocrit  Date Value Ref Range Status  07/18/2022 40.2 34.0 - 46.6 % Final   MCHC  Date Value Ref Range Status  07/18/2022 32.6 31.5 - 35.7 g/dL Final  82/95/6213 08.6 30.0 - 36.0 g/dL Final   [facility]  Date Value Ref Range Status  07/18/2022 25.7 (L) 26.6 - 33.0 pg Final  06/17/2013 26.2 26.0 - 34.0 pg Final   MCV  Date Value  Ref Range Status  07/18/2022 79 79 - 97 fL Final   No results found for: "PLTCOUNTKUC", "LABPLAT", "POCPLA" RDW  Date Value Ref Range Status  07/18/2022 15.5 (H) 11.7 - 15.4 % Final         Passed - HBA1C is between 0 and 7.9 and within 180 days    Hgb A1c MFr Bld  Date Value Ref Range Status  07/18/2022 6.7 (H) 4.8 - 5.6 % Final    Comment:             Prediabetes: 5.7 - 6.4          Diabetes: >6.4          Glycemic control for adults with diabetes: <7.0          Passed - Valid encounter within last 6 months    Recent Outpatient Visits           1 month ago Annual physical exam   [place] [facility] Ronnald Ramp, MD       Future Appointments  In 1 week Simmons-Robinson, Tawanna Cooler, MD [facility], [facility]   In 1 month Simmons-Robinson, [place], MD [facility], [place]

## 2022-09-07 ENCOUNTER — Ambulatory Visit (INDEPENDENT_AMBULATORY_CARE_PROVIDER_SITE_OTHER): Payer: BC Managed Care – PPO | Admitting: Family Medicine

## 2022-09-07 ENCOUNTER — Encounter: Payer: Self-pay | Admitting: Family Medicine

## 2022-09-07 VITALS — BP 142/80 | HR 76 | Temp 98.2°F | Resp 12 | Ht 62.0 in | Wt 141.5 lb

## 2022-09-07 DIAGNOSIS — E785 Hyperlipidemia, unspecified: Secondary | ICD-10-CM

## 2022-09-07 DIAGNOSIS — E782 Mixed hyperlipidemia: Secondary | ICD-10-CM | POA: Insufficient documentation

## 2022-09-07 DIAGNOSIS — E119 Type 2 diabetes mellitus without complications: Secondary | ICD-10-CM | POA: Diagnosis not present

## 2022-09-07 DIAGNOSIS — E1169 Type 2 diabetes mellitus with other specified complication: Secondary | ICD-10-CM | POA: Diagnosis not present

## 2022-09-07 DIAGNOSIS — I10 Essential (primary) hypertension: Secondary | ICD-10-CM

## 2022-09-07 DIAGNOSIS — R051 Acute cough: Secondary | ICD-10-CM | POA: Diagnosis not present

## 2022-09-07 MED ORDER — METFORMIN HCL 500 MG PO TABS
1000.0000 mg | ORAL_TABLET | Freq: Two times a day (BID) | ORAL | 1 refills | Status: AC
Start: 2022-09-07 — End: ?

## 2022-09-07 MED ORDER — AMLODIPINE-OLMESARTAN 5-20 MG PO TABS
1.0000 | ORAL_TABLET | Freq: Every day | ORAL | 1 refills | Status: AC
Start: 2022-09-07 — End: ?

## 2022-09-07 NOTE — Assessment & Plan Note (Signed)
Acute  Improvement noted with Tessalon Perles (benzonatate) as needed. No current symptoms. -Continue Tessalon Perles as needed for cough. Recommended starting cetirizine 10mg  daily to help with what appears to be allergy symptoms as well

## 2022-09-07 NOTE — Assessment & Plan Note (Deleted)
Elevated blood pressure in the clinic today. Patient has been inconsistently taking amlodipine 5mg  daily. Discussed the importance of daily medication adherence. -recently diagnosed  -urine albumin collected today  -Discontinue amlodipine 5mg  daily. -Start amlodipine/olmesartan 5/20mg  daily for BP and ARB benefit for renal  -Obtain home blood pressure monitor and check blood pressure regularly.

## 2022-09-07 NOTE — Assessment & Plan Note (Signed)
Patient is currently on rosuvastatin 20mg  daily. No issues reported. -Chronic  -Continue rosuvastatin 20mg  daily.

## 2022-09-07 NOTE — Patient Instructions (Signed)
VISIT SUMMARY:  During your visit, we discussed your hypertension, hyperlipidemia, chronic cough, allergic rhinitis, and type 2 diabetes. We made some changes to your medication regimen to better manage these conditions. We also discussed the importance of taking your medications regularly and monitoring your health at home.  YOUR PLAN:  -HYPERTENSION: Hypertension, or high blood pressure, can increase your risk of heart disease and stroke. We're changing your medication to amlodipine/olmesartan 5/20mg  daily to better control your blood pressure. It's important to take this medication every day.  -HYPERLIPIDEMIA: Hyperlipidemia means you have high levels of fats in your blood, which can increase your risk of heart disease. You should continue taking rosuvastatin 20mg  daily to manage this condition.  -CHRONIC COUGH: Your chronic cough has improved with Tessalon Perles. Continue taking this medication as needed.  -ALLERGIC RHINITIS: Allergic rhinitis, or allergies, can cause symptoms like watery eyes and a tickling sensation in your throat. Start taking cetirizine 10mg  daily as needed to manage these symptoms.  -TYPE 2 DIABETES MELLITUS: Type 2 diabetes affects how your body uses glucose (sugar). We're increasing your metformin dose to 1000mg  daily to better control your blood sugar levels. After 2 weeks, if you're tolerating the medication well, we'll increase the dose to 1000mg  twice daily.  INSTRUCTIONS:  Please obtain a home blood pressure monitor and check your blood pressure regularly. We also collected a urine sample today to check for protein, which can be a sign of kidney damage. We'll discuss the results at your follow-up appointment in September.

## 2022-09-07 NOTE — Assessment & Plan Note (Signed)
Elevated blood pressure in the clinic today. Patient has been inconsistently taking amlodipine 5mg  daily. Discussed the importance of daily medication adherence. -recently diagnosed  -urine albumin collected today  -Discontinue amlodipine 5mg  daily. -Start amlodipine/olmesartan 5/20mg  daily for BP and ARB benefit for renal  -Obtain home blood pressure monitor and check blood pressure regularly.

## 2022-09-07 NOTE — Assessment & Plan Note (Signed)
Patient is currently on metformin 500mg  daily. Discussed increasing the dose to improve glycemic control. -new diagnosis  -Increase metformin to 1000mg  daily. -After 2 weeks, if well-tolerated, increase to 1000mg  twice daily. Albumin collected today  ARB added for renal benefit

## 2022-09-07 NOTE — Progress Notes (Signed)
Established patient visit   Patient: Brittany Underwood   DOB: [birth year]   57 y.o. Female  MRN: [id number] Visit Date: 09/07/2022  Today's healthcare provider: Ronnald Ramp, MD   Chief Complaint  Patient presents with   Medical Management of Chronic Issues    Patient reports fair compliance and good tolerance of medication. She is having trouble remembering to take medications daily. She is not checking BP at home.    Subjective     HPI     Medical Management of Chronic Issues    Additional comments: Patient reports fair compliance and good tolerance of medication. She is having trouble remembering to take medications daily. She is not checking BP at home.       Last edited by Myles Lipps, CMA on 09/07/2022  3:04 PM.      Discussed the use of AI scribe software for clinical note transcription with the patient, who gave verbal consent to proceed.  History of Present Illness   The patient, with a history of hypertension, hyperlipidemia, and diabetes, presents for a follow-up visit. She reports that her blood pressure has been slightly elevated recently, despite taking amlodipine 5mg  daily. The patient admits to occasionally skipping her antihypertensive medication, but commits to taking it daily moving forward. She also takes rosuvastatin 20mg  for hyperlipidemia and metformin 500mg  for diabetes, both of which she tolerates well.  The patient also reports a history of cough, which has improved and is currently managed as needed with Tessalon Perzl. She has been prescribed cetirizine for allergies, but it is unclear if she has been taking this medication regularly. The patient describes occasional watery eyes and a tickling sensation in her throat, which may be indicative of allergies.  The patient is also on a medication regimen for diabetes, but was unsure of the specifics. After reviewing her medications, it was confirmed that she is taking metformin  500mg  daily.  The patient's father had a history of stroke, which is a concern for her given her own medical conditions. She is committed to managing her health to prevent similar complications.         DM, new diagnosis with A1c 6.7 - recommended metformin 500mg  BID  Medications: Outpatient Medications Prior to Visit  Medication Sig   benzonatate (TESSALON) 200 MG capsule Take 1 capsule (200 mg total) by mouth 2 (two) times daily as needed for cough.   cetirizine (ZYRTEC) 10 MG tablet Take 1 tablet (10 mg total) by mouth daily.   IUD's (PARAGARD INTRAUTERINE COPPER IU) by Intrauterine route.   rosuvastatin (CRESTOR) 20 MG tablet Take 1 tablet (20 mg total) by mouth daily.   [DISCONTINUED] amLODipine (NORVASC) 5 MG tablet Take 1 tablet (5 mg total) by mouth daily.   [DISCONTINUED] metFORMIN (GLUCOPHAGE) 500 MG tablet TAKE 1 TABLET(500 MG) BY MOUTH DAILY WITH BREAKFAST   No facility-administered medications prior to visit.    Review of Systems       Objective    BP (!) 142/80   Pulse 76   Temp 98.2 F (36.8 C) (Temporal)   Resp 12   Ht 5\' 2"  (1.575 m)   Wt 141 lb 8 oz (64.2 kg)   SpO2 100%   BMI 25.88 kg/m      Physical Exam Vitals reviewed.  Constitutional:      General: She is not in acute distress.    Appearance: Normal appearance. She is not ill-appearing, toxic-appearing or diaphoretic.  Eyes:  Conjunctiva/sclera: Conjunctivae normal.  Cardiovascular:     Rate and Rhythm: Normal rate and regular rhythm.     Pulses: Normal pulses.     Heart sounds: Normal heart sounds. No murmur heard.    No friction rub. No gallop.  Pulmonary:     Effort: Pulmonary effort is normal. No respiratory distress.     Breath sounds: Normal breath sounds. No stridor. No wheezing, rhonchi or rales.  Abdominal:     General: Bowel sounds are normal. There is no distension.     Palpations: Abdomen is soft.     Tenderness: There is no abdominal tenderness.  Musculoskeletal:      Right lower leg: No edema.     Left lower leg: No edema.  Skin:    Findings: No erythema or rash.  Neurological:     Mental Status: She is alert and oriented to person, place, and time.       No results found for any visits on 09/07/22.  Assessment & Plan     Problem List Items Addressed This Visit     Acute cough    Acute  Improvement noted with Tessalon Perles (benzonatate) as needed. No current symptoms. -Continue Tessalon Perles as needed for cough. Recommended starting cetirizine 10mg  daily to help with what appears to be allergy symptoms as well       Diabetes mellitus without complication (HCC) - Primary    Patient is currently on metformin 500mg  daily. Discussed increasing the dose to improve glycemic control. -new diagnosis  -Increase metformin to 1000mg  daily. -After 2 weeks, if well-tolerated, increase to 1000mg  twice daily. Albumin collected today  ARB added for renal benefit       Relevant Medications   metFORMIN (GLUCOPHAGE) 500 MG tablet   amLODipine-olmesartan (AZOR) 5-20 MG tablet   Other Relevant Orders   Urine microalbumin-creatinine with uACR   Elevated cholesterol with elevated triglycerides    Patient is currently on rosuvastatin 20mg  daily. No issues reported. -Chronic  -Continue rosuvastatin 20mg  daily.      Relevant Medications   amLODipine-olmesartan (AZOR) 5-20 MG tablet   Primary hypertension    Elevated blood pressure in the clinic today. Patient has been inconsistently taking amlodipine 5mg  daily. Discussed the importance of daily medication adherence. -recently diagnosed  -urine albumin collected today  -Discontinue amlodipine 5mg  daily. -Start amlodipine/olmesartan 5/20mg  daily for BP and ARB benefit for renal  -Obtain home blood pressure monitor and check blood pressure regularly.      Relevant Medications   amLODipine-olmesartan (AZOR) 5-20 MG tablet            No follow-ups on file.         Ronnald Ramp, MD  [facility] [phone] (phone) [phone] (fax)  [facility]

## 2022-09-12 ENCOUNTER — Encounter: Payer: Self-pay | Admitting: Family Medicine

## 2022-10-20 ENCOUNTER — Ambulatory Visit: Payer: BC Managed Care – PPO | Admitting: Family Medicine

## 2022-10-22 ENCOUNTER — Other Ambulatory Visit: Payer: Self-pay | Admitting: Family Medicine

## 2022-10-24 NOTE — Telephone Encounter (Signed)
Requested Prescriptions  Refused Prescriptions Disp Refills   amLODipine (NORVASC) 5 MG tablet [Pharmacy Med Name: AMLODIPINE BESYLATE 5MG  TABLETS] 90 tablet 1    Sig: TAKE 1 TABLET(5 MG) BY MOUTH DAILY     Cardiovascular: Calcium Channel Blockers 2 Failed - 10/22/2022  8:09 AM      Failed - Last BP in normal range    BP Readings from Last 1 Encounters:  09/07/22 (!) 142/80         Passed - Last Heart Rate in normal range    Pulse Readings from Last 1 Encounters:  09/07/22 76         Passed - Valid encounter within last 6 months    Recent Outpatient Visits           1 month ago Diabetes mellitus without complication (HCC)   [place] [facility] Simmons-Robinson, [place], MD   3 months ago Annual physical exam   [place] [facility] [place], Tawanna Cooler, MD

## 2022-11-11 ENCOUNTER — Other Ambulatory Visit: Payer: Self-pay | Admitting: Family Medicine

## 2022-11-11 DIAGNOSIS — Z1212 Encounter for screening for malignant neoplasm of rectum: Secondary | ICD-10-CM

## 2022-11-11 DIAGNOSIS — Z1211 Encounter for screening for malignant neoplasm of colon: Secondary | ICD-10-CM

## 2022-12-07 ENCOUNTER — Other Ambulatory Visit: Payer: Self-pay | Admitting: Family Medicine

## 2022-12-07 DIAGNOSIS — E119 Type 2 diabetes mellitus without complications: Secondary | ICD-10-CM

## 2022-12-07 DIAGNOSIS — I10 Essential (primary) hypertension: Secondary | ICD-10-CM

## 2022-12-07 NOTE — Telephone Encounter (Signed)
Reordered 09/07/22 #90 1 RF  Requested Prescriptions  Refused Prescriptions Disp Refills   amLODipine-olmesartan (AZOR) 5-20 MG tablet [Pharmacy Med Name: AMLODIPINE/OLMES MEDOXOM 5-20MG  T] 90 tablet 1    Sig: TAKE 1 TABLET BY MOUTH DAILY     Cardiovascular: CCB + ARB Combos Failed - 12/07/2022  8:09 AM      Failed - Cr in normal range and within 180 days    Creat  Date Value Ref Range Status  06/17/2013 0.95 0.50 - 1.10 mg/dL Final   Creatinine, Ser  Date Value Ref Range Status  07/18/2022 1.18 (H) 0.57 - 1.00 mg/dL Final         Failed - Last BP in normal range    BP Readings from Last 1 Encounters:  09/07/22 (!) 142/80         Passed - K in normal range and within 180 days    Potassium  Date Value Ref Range Status  07/18/2022 4.2 3.5 - 5.2 mmol/L Final         Passed - Na in normal range and within 180 days    Sodium  Date Value Ref Range Status  07/18/2022 144 134 - 144 mmol/L Final         Passed - Patient is not pregnant      Passed - Valid encounter within last 6 months    Recent Outpatient Visits           3 months ago Diabetes mellitus without complication (HCC)   [place] [facility] Simmons-Robinson, Tawanna Cooler, MD   4 months ago Annual physical exam   [place] [facility] [place], Tawanna Cooler, MD

## 2023-11-03 NOTE — Progress Notes (Signed)
 This patient is appearing on a report for being at risk of failing the Glycemic Status Assessment in Diabetes measure this calendar year.   Last documented A1c 6.7% on 07/18/22. Patient is due for an updated A1c.  Brittne Kawasaki E. Marsh, PharmD Clinical Pharmacist [facility] [phone]
